# Patient Record
Sex: Female | Born: 1945 | Race: Black or African American | Hispanic: No | Marital: Single | Smoking: Former smoker
Health system: Southern US, Community
[De-identification: ages and names within clinical notes are randomized; demographics above are authoritative.]

## PROBLEM LIST (undated history)

## (undated) DIAGNOSIS — E7800 Pure hypercholesterolemia, unspecified: Secondary | ICD-10-CM

## (undated) DIAGNOSIS — I10 Essential (primary) hypertension: Secondary | ICD-10-CM

## (undated) HISTORY — PX: TOTAL ABDOMINAL HYSTERECTOMY: SHX209

## (undated) HISTORY — DX: Essential (primary) hypertension: I10

## (undated) HISTORY — PX: CARPAL TUNNEL RELEASE: SHX101

## (undated) HISTORY — DX: Pure hypercholesterolemia, unspecified: E78.00

## (undated) HISTORY — PX: OTHER SURGICAL HISTORY: SHX169

---

## 2009-06-21 HISTORY — PX: COLONOSCOPY: SHX174

## 2010-05-26 ENCOUNTER — Encounter: Payer: Self-pay | Admitting: Gastroenterology

## 2010-06-01 ENCOUNTER — Ambulatory Visit (HOSPITAL_COMMUNITY)
Admission: RE | Admit: 2010-06-01 | Discharge: 2010-06-01 | Payer: Self-pay | Source: Home / Self Care | Attending: Gastroenterology | Admitting: Gastroenterology

## 2010-07-21 NOTE — Letter (Signed)
Summary: [facility] ORDER/TRIAGE  [facility] ORDER/TRIAGE   Imported By: Rexene Alberts 05/26/2010 15:40:57  _____________________________________________________________________  External Attachment:    Type:   Image     Comment:   External Document

## 2017-07-26 ENCOUNTER — Other Ambulatory Visit: Payer: Self-pay | Admitting: General Surgery

## 2017-07-26 DIAGNOSIS — R2231 Localized swelling, mass and lump, right upper limb: Secondary | ICD-10-CM

## 2017-08-17 ENCOUNTER — Ambulatory Visit: Payer: Medicare HMO

## 2017-08-25 ENCOUNTER — Ambulatory Visit
Admission: RE | Admit: 2017-08-25 | Discharge: 2017-08-25 | Disposition: A | Discharge status: Home / Self Care | Payer: Medicare HMO | Source: Ambulatory Visit | Attending: General Surgery | Admitting: General Surgery

## 2017-08-25 DIAGNOSIS — D171 Benign lipomatous neoplasm of skin and subcutaneous tissue of trunk: Secondary | ICD-10-CM | POA: Diagnosis not present

## 2017-08-25 DIAGNOSIS — M19011 Primary osteoarthritis, right shoulder: Secondary | ICD-10-CM | POA: Diagnosis not present

## 2017-08-25 DIAGNOSIS — R2231 Localized swelling, mass and lump, right upper limb: Secondary | ICD-10-CM | POA: Insufficient documentation

## 2017-08-25 DIAGNOSIS — M7581 Other shoulder lesions, right shoulder: Secondary | ICD-10-CM | POA: Insufficient documentation

## 2017-08-25 DIAGNOSIS — M7551 Bursitis of right shoulder: Secondary | ICD-10-CM | POA: Diagnosis not present

## 2017-08-25 MED ORDER — GADOBENATE DIMEGLUMINE 529 MG/ML IV SOLN
20.0000 mL | Freq: Once | INTRAVENOUS | Status: AC | PRN
  Administered 2017-08-25: 5 mL via INTRAVENOUS

## 2019-05-20 IMAGING — MR MR HUMERUS*R* WO/W CM
9 series · 40 of 40 positions shown · IV contrast (multihance)
Comparison: None.

CLINICAL DATA: Swelling and occasional pain in the right upper arm
for approximately 2.5 years. Area of concern is enlarging.

EXAM:
MRI OF THE RIGHT HUMERUS WITHOUT AND WITH CONTRAST
TECHNIQUE: Multiplanar, multisequence MR imaging of the right humerus was
performed before and after the administration of intravenous
contrast.
CONTRAST:  5 ml MULTIHANCE GADOBENATE DIMEGLUMINE 529 MG/ML IV SOLN

[Series 3: T1 · axial · 5.0mm · 1.37mm/px · z∈[-70,+138]mm · 5 of 33 slices shown (1 of 2)]
[im 1/33]
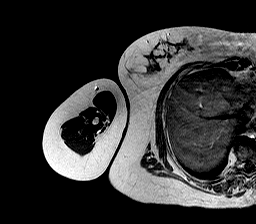
[im 9/33]
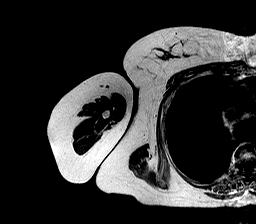
[im 17/33]
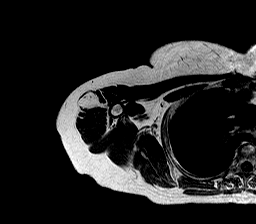
[im 25/33]
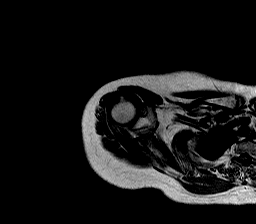
[im 33/33]
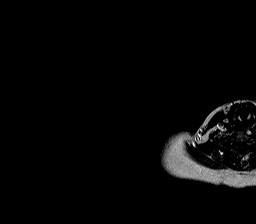

[Series 4: T1 fat-sat · axial · 5.0mm · 1.37mm/px · z∈[-70,+138]mm · 4 of 33 slices shown]
[im 1/33]
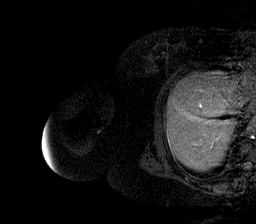
[im 11/33]
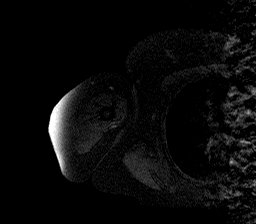
[im 22/33]
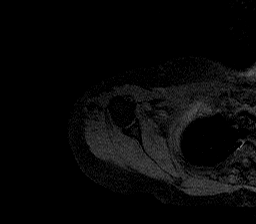
[im 33/33]
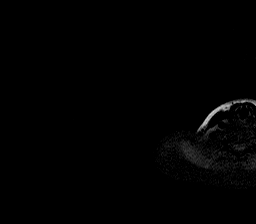

[Series 5: T2 fat-sat · axial · 5.0mm · 0.68mm/px · z∈[-70,+138]mm · 4 of 33 slices shown (1 of 3)]
[im 1/33]
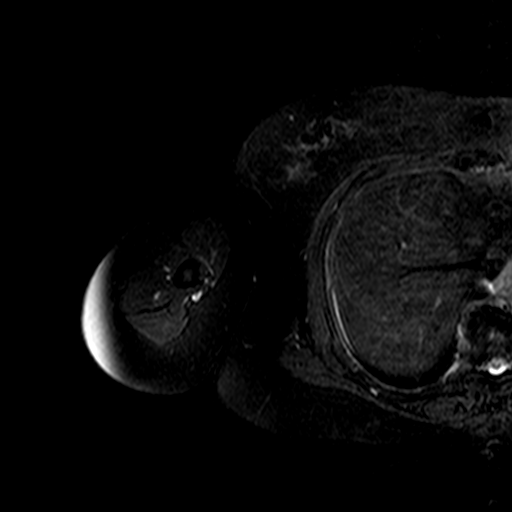
[im 11/33]
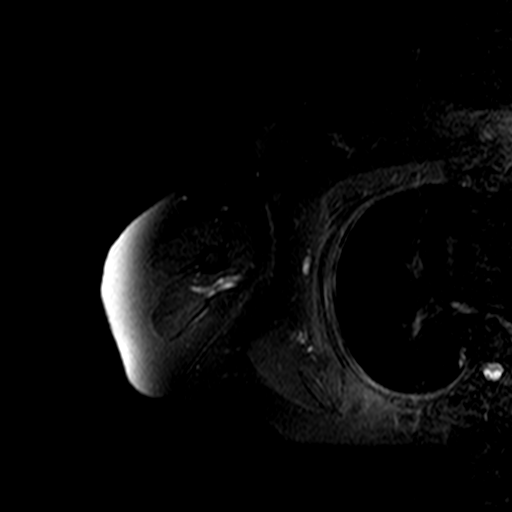
[im 22/33]
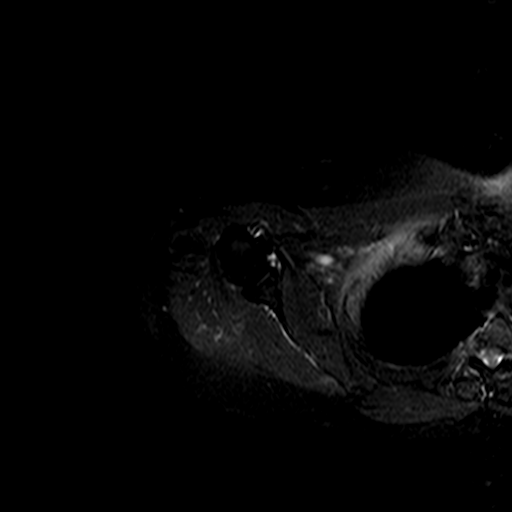
[im 33/33]
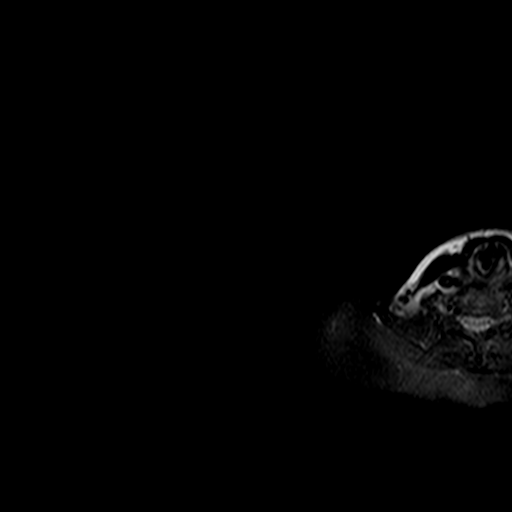

[Series 6: T2 fat-sat · coronal · 4.0mm · 0.68mm/px · 5 of 35 slices shown (2 of 3)]
[im 1/35]
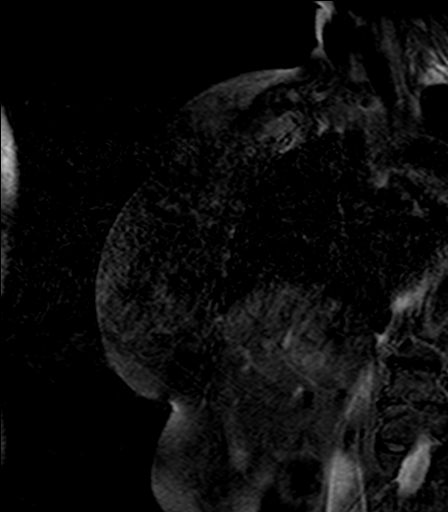
[im 9/35]
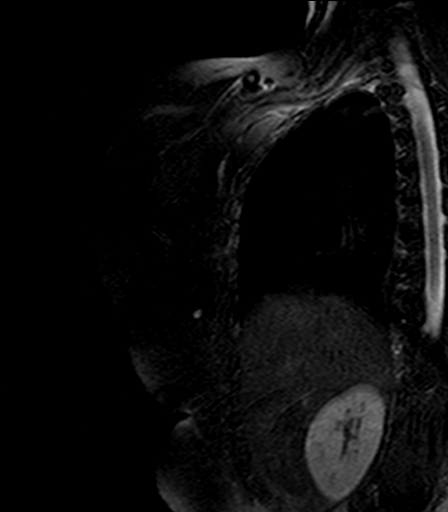
[im 18/35]
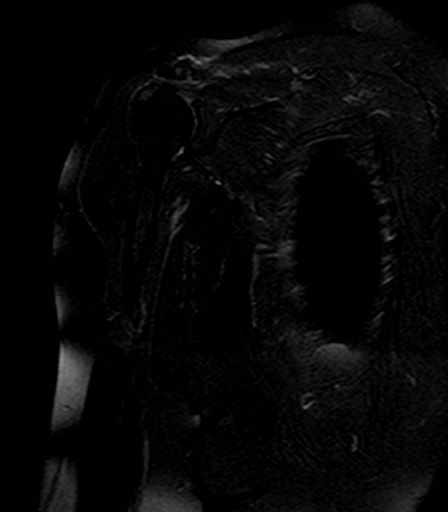
[im 26/35]
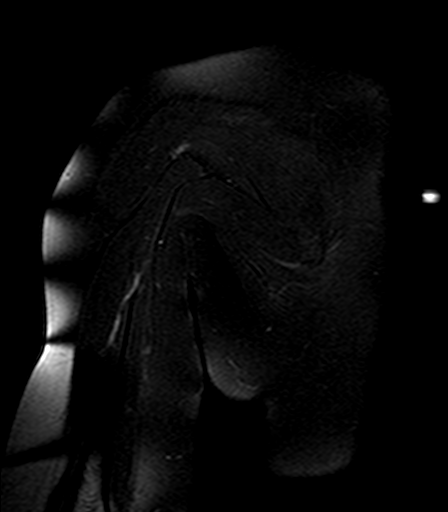
[im 35/35]
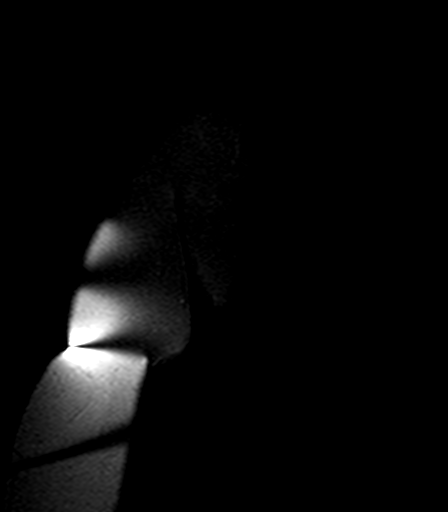

[Series 7: T1 · coronal · 4.0mm · 1.37mm/px · 5 of 35 slices shown (2 of 2)]
[im 1/35]
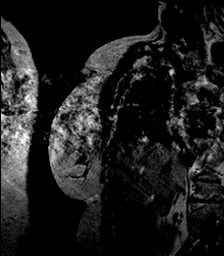
[im 9/35]
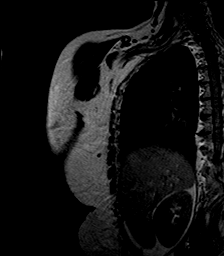
[im 18/35]
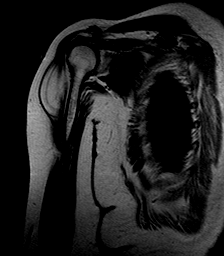
[im 26/35]
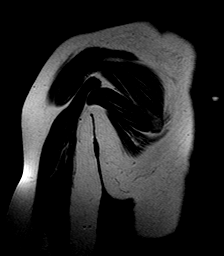
[im 35/35]
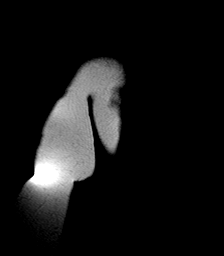

[Series 8: T2 fat-sat · oblique · 4.0mm · 0.78mm/px · 4 of 29 slices shown (3 of 3)]
[im 1/29]
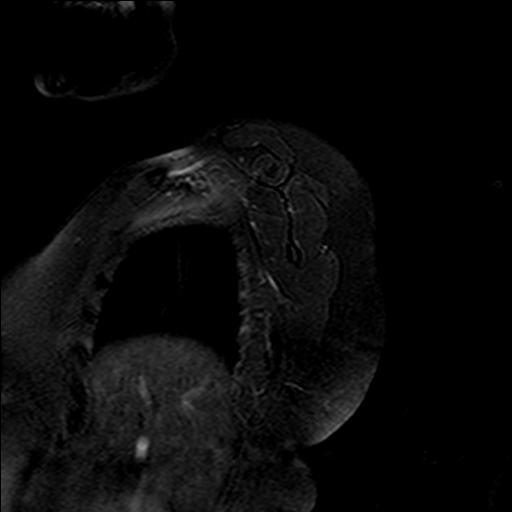
[im 10/29]
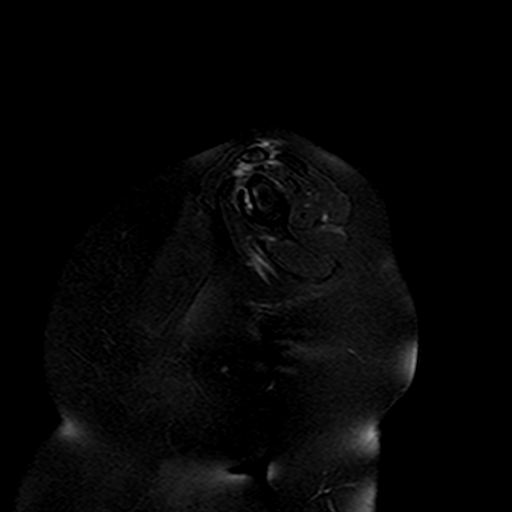
[im 19/29]
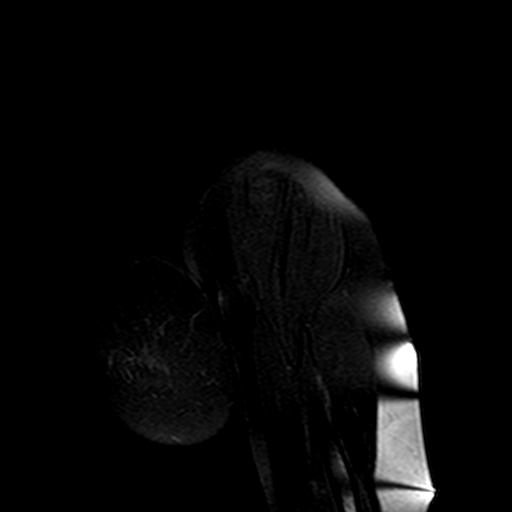
[im 29/29]
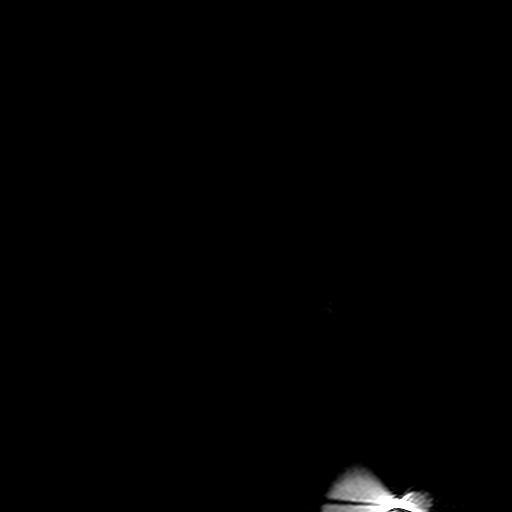

[Series 11: T1 fat-sat post-contrast · oblique · 4.0mm · 1.56mm/px · 4 of 29 slices shown (1 of 3)]
[im 1/29]
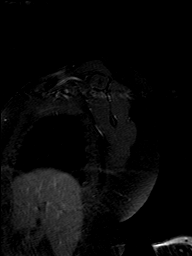
[im 10/29]
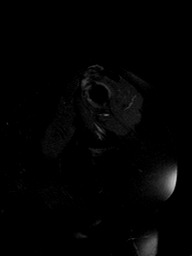
[im 19/29]
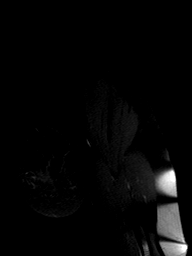
[im 29/29]
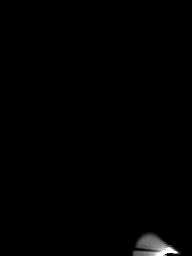

[Series 12: T1 fat-sat post-contrast · axial · 5.0mm · 1.56mm/px · z∈[-70,+138]mm · 4 of 33 slices shown (2 of 3)]
[im 1/33]
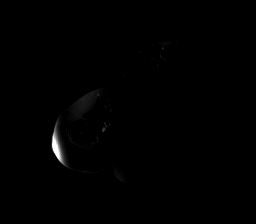
[im 11/33]
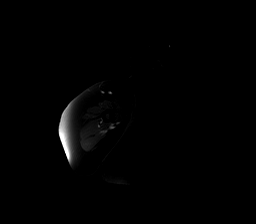
[im 22/33]
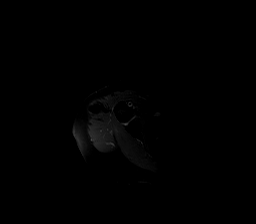
[im 33/33]
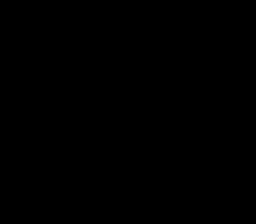

[Series 13: T1 fat-sat post-contrast · coronal · 4.0mm · 1.37mm/px · 5 of 35 slices shown (3 of 3)]
[im 1/35]
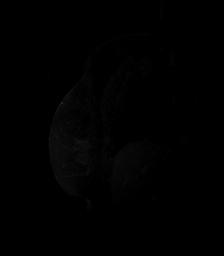
[im 9/35]
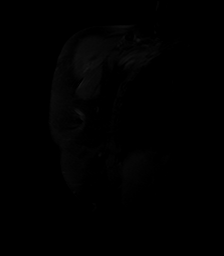
[im 18/35]
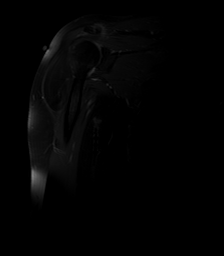
[im 26/35]
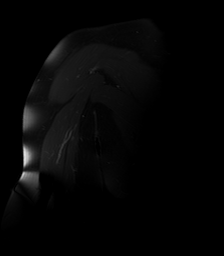
[im 35/35]
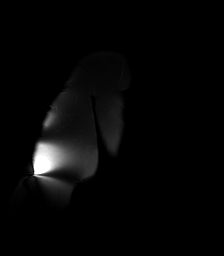

[40 of 40 positions shown; findings below may reference images not displayed]

FINDINGS: Bones/Joint/Cartilage

No fracture or focal lesion. Moderate to moderately severe
acromioclavicular osteoarthritis is identified. Small degenerative
cyst in the greater tuberosity related to rotator cuff tendinopathy
is seen.

Ligaments

Intact.

Muscles and Tendons

Rotator cuff tendinopathy without tear.  Otherwise negative.

Soft tissues

Markers are placed about the region of concern. Subjacent to the
markers, there is a T1 hyperintense lesion in the deltoid measuring
7.5 cm craniocaudal by 2.4 cm AP by 2.6 cm transverse. A few fine
septations or muscular fibers are present within the lesion. The
lesion demonstrates complete signal dropout on T2 weighted imaging
and does not enhance. Imaged soft tissues are otherwise
unremarkable. Fluid is present in the subacromial/subdeltoid bursa.
IMPRESSION: Lesion in the region of concern has an appearance most consistent
with a simple lipoma in the deltoid.

Rotator cuff tendinopathy. Although this is not a dedicated shoulder
examination, no tear is seen.

Moderate to moderately severe acromioclavicular osteoarthritis.

Subacromial/subdeltoid bursitis.

## 2020-05-14 ENCOUNTER — Encounter: Payer: Self-pay | Admitting: Internal Medicine

## 2020-05-22 ENCOUNTER — Encounter: Payer: Self-pay | Admitting: Internal Medicine

## 2020-07-15 ENCOUNTER — Ambulatory Visit: Payer: Medicare HMO | Admitting: Gastroenterology

## 2020-08-27 ENCOUNTER — Other Ambulatory Visit: Payer: Self-pay

## 2020-08-27 ENCOUNTER — Ambulatory Visit: Payer: Self-pay | Admitting: Gastroenterology

## 2020-10-08 ENCOUNTER — Other Ambulatory Visit: Payer: Self-pay

## 2020-10-08 ENCOUNTER — Encounter: Payer: Self-pay | Admitting: *Deleted

## 2020-10-08 ENCOUNTER — Encounter: Payer: Self-pay | Admitting: Gastroenterology

## 2020-10-08 ENCOUNTER — Ambulatory Visit (INDEPENDENT_AMBULATORY_CARE_PROVIDER_SITE_OTHER): Payer: Medicare HMO | Admitting: Gastroenterology

## 2020-10-08 DIAGNOSIS — Z1211 Encounter for screening for malignant neoplasm of colon: Secondary | ICD-10-CM

## 2020-10-08 MED ORDER — PEG 3350-KCL-NA BICARB-NACL 420 G PO SOLR: ORAL | 0 refills | Status: AC

## 2020-10-08 NOTE — Patient Instructions (Signed)
We are arranging a colonoscopy in the near future with Dr. Abbey Chatters.  Please stop iron 7 days prior to the procedure.  Further recommendations to follow!  It was a pleasure to see you today. I want to create trusting relationships with patients to provide genuine, compassionate, and quality care. I value your feedback. If you receive a survey regarding your visit,  I greatly appreciate you taking time to fill this out.   [name], PhD, ANP-BC [facility]

## 2020-10-08 NOTE — Addendum Note (Signed)
Addended by: Cheron Every on: 10/08/2020 10:42 AM   Modules accepted: Orders

## 2020-10-08 NOTE — Progress Notes (Signed)
Primary Care Physician:  Renee Rival, NP  Referring Provider: Angelina Ok, NP Primary Gastroenterologist:  Dr. Abbey Chatters, previously Dr. Oneida Alar   Chief Complaint  Patient presents with  . Colonoscopy    Last tcs 11 years ago at [facility] Doing ok    HPI:   Shannon Larson is a 75 y.o. female presenting today at the request of Angelina Ok, NP, due to need for screening colonoscopy. Her last procedure was by Dr. Oneida Alar in 2011 with diverticulosis. No family history of colorectal cancer or polyps.   She did note weight loss earlier this year from around high 180s to 175. However, she has gained this back.  Had changed diet from 3 meals to 2 meals a day and was eating earlier. Started eating more veggies. Cut back some on starches. Back to baseline weight. No abdominal pain. No nausea or vomiting. No GERD or dysphagia. No overt GI bleeding.    Past Medical History:  Diagnosis Date  . HTN (hypertension)   . Hypercholesterolemia     Past Surgical History:  Procedure Laterality Date  . CARPAL TUNNEL RELEASE Right   . cataracts    . COLONOSCOPY  2011   left-sided diverticulosis most pronounced in sigmoid, small internal hemorrhoids.   Marland Kitchen TOTAL ABDOMINAL HYSTERECTOMY      Current Outpatient Medications  Medication Sig Dispense Refill  . acyclovir (ZOVIRAX) 200 MG capsule Take 200 mg by mouth daily.    Marland Kitchen amLODipine (NORVASC) 10 MG tablet Take 1 tablet by mouth daily.    Marland Kitchen aspirin EC 81 MG tablet Take 81 mg by mouth daily. Swallow whole.    . benazepril (LOTENSIN) 40 MG tablet Take 40 mg by mouth daily.    . diphenhydrAMINE (BENADRYL) 25 MG tablet Take 50 mg by mouth daily.    Marland Kitchen estradiol (CLIMARA - DOSED IN MG/24 HR) 0.05 mg/24hr patch Place 0.05 mg onto the skin 2 (two) times a week.    . ferrous sulfate 325 (65 FE) MG tablet Take 325 mg by mouth daily.    . naproxen sodium (ALEVE) 220 MG tablet Take 440 mg by mouth as needed.    Marland Kitchen OVER THE COUNTER MEDICATION CBD  Salve Herbal Zero TCS 250mg  as needed    . rosuvastatin (CRESTOR) 20 MG tablet Take 20 mg by mouth daily.    . timolol (TIMOPTIC) 0.5 % ophthalmic solution Place 1 drop into both eyes every morning.    . Vitamin D, Ergocalciferol, (DRISDOL) 1.25 MG (50000 UNIT) CAPS capsule Take 1 capsule by mouth once a week.    . zinc gluconate 50 MG tablet Take 50 mg by mouth daily.     No current facility-administered medications for this visit.    Allergies as of 10/08/2020 - Review Complete 10/08/2020  Allergen Reaction Noted  . Other  10/08/2020  . Shellfish allergy  10/08/2020    Family History  Problem Relation Age of Onset  . Prostate cancer Father   . Breast cancer Sister   . Colon polyps Neg Hx   . Colon cancer Neg Hx     Social History   Socioeconomic History  . Marital status: Single    Spouse name: Not on file  . Number of children: Not on file  . Years of education: Not on file  . Highest education level: Not on file  Occupational History  . 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  Smoking status: Former Research scientist (life sciences)  . Smokeless tobacco: Never Used  Substance and Sexual Activity  . Alcohol use: Yes    Comment: glass of wine daily  . Drug use: Never  . Sexual activity: Not on file  Other Topics Concern  . Not on file  Social History Narrative  . Not on file   Social Determinants of Health   Financial Resource Strain: Not on file  Food Insecurity: Not on file  Transportation Needs: Not on file  Physical Activity: Not on file  Stress: Not on file  Social Connections: Not on file  Intimate Partner Violence: Not on file    Review of Systems: Gen: Denies any fever, chills, fatigue, weight loss, lack of appetite.  CV: Denies chest pain, heart palpitations, peripheral edema, syncope.  Resp: Denies shortness of breath at rest or with exertion. Denies wheezing or cough.  GI: see HPI GU : Denies urinary burning, urinary  frequency, urinary hesitancy MS: Denies joint pain, muscle weakness, cramps, or limitation of movement.  Derm: Denies rash, itching, dry skin Psych: Denies depression, anxiety, memory loss, and confusion Heme: Denies bruising, bleeding, and enlarged lymph nodes.  Physical Exam: BP 130/70   Pulse 63   Temp (!) 96.9 F (36.1 C) (Temporal)   Ht 5\' 6"  (1.676 m)   Wt 189 lb 3.2 oz (85.8 kg)   BMI 30.54 kg/m  General:   Alert and oriented. Pleasant and cooperative. Well-nourished and well-developed.  Head:  Normocephalic and atraumatic. Eyes:  Without icterus, sclera clear and conjunctiva pink.  Ears:  Normal auditory acuity. Mouth:  Mask in place Lungs:  Clear to auscultation bilaterally. No wheezes, rales, or rhonchi. No distress.  Heart:  S1, S2 present without murmurs appreciated.  Abdomen:  +BS, soft, non-tender and non-distended. No HSM noted. No guarding or rebound. No masses appreciated.  Rectal:  Deferred  Msk:  Symmetrical without gross deformities. Normal posture. Extremities:  Without edema. Neurologic:  Alert and  oriented x4;  grossly normal neurologically. Skin:  Intact without significant lesions or rashes. Psych:  Alert and cooperative. Normal mood and affect.  ASSESSMENT: Shannon Larson is a delightful 75 y.o. female presenting today with need for routine screening colonoscopy, without any family history of colorectal cancer or polyps. Last colonoscopy without polyps in 2011.  She is in excellent health and would do well with screening colonoscopy at this time. She did note weight loss earlier this year in the setting of dietary changes but has actually gained back to her baseline. She has no concerning lower or upper GI signs/symptoms.    PLAN:  Proceed with colonoscopy by Dr. Abbey Chatters  in near future: the risks, benefits, and alternatives have been discussed with the patient in detail. The patient states understanding and desires to proceed.   Hold iron 7 days  prior  [name], PhD, [facility]

## 2020-11-25 ENCOUNTER — Telehealth: Payer: Self-pay | Admitting: Internal Medicine

## 2020-11-25 NOTE — Telephone Encounter (Signed)
Pt wants to cancel her procedure with Dr Abbey Chatters on 6/17 due to the high rise in covid cases. She is aware that she will need another OV prior to scheduling, She wants to wait until next year. [phone]

## 2020-11-26 NOTE — Telephone Encounter (Signed)
Called pt. Aware procedure cancelled. Message sent to endo

## 2020-12-03 ENCOUNTER — Other Ambulatory Visit (HOSPITAL_COMMUNITY): Payer: Medicare HMO

## 2020-12-05 ENCOUNTER — Ambulatory Visit (HOSPITAL_COMMUNITY): Admission: RE | Admit: 2020-12-05 | Payer: Medicare HMO | Source: Home / Self Care

## 2020-12-05 ENCOUNTER — Encounter (HOSPITAL_COMMUNITY): Admission: RE | Payer: Self-pay | Source: Home / Self Care

## 2020-12-05 SURGERY — COLONOSCOPY WITH PROPOFOL
Anesthesia: Monitor Anesthesia Care

## 2021-05-14 DIAGNOSIS — G4733 Obstructive sleep apnea (adult) (pediatric): Secondary | ICD-10-CM | POA: Insufficient documentation

## 2021-05-14 DIAGNOSIS — I509 Heart failure, unspecified: Secondary | ICD-10-CM | POA: Insufficient documentation

## 2021-05-14 DIAGNOSIS — R55 Syncope and collapse: Secondary | ICD-10-CM | POA: Insufficient documentation

## 2021-05-14 DIAGNOSIS — N179 Acute kidney failure, unspecified: Secondary | ICD-10-CM | POA: Insufficient documentation

## 2021-05-14 DIAGNOSIS — E119 Type 2 diabetes mellitus without complications: Secondary | ICD-10-CM | POA: Insufficient documentation

## 2021-05-14 DIAGNOSIS — I10 Essential (primary) hypertension: Secondary | ICD-10-CM | POA: Insufficient documentation

## 2021-05-14 DIAGNOSIS — J101 Influenza due to other identified influenza virus with other respiratory manifestations: Secondary | ICD-10-CM | POA: Insufficient documentation

## 2021-05-14 DIAGNOSIS — J449 Chronic obstructive pulmonary disease, unspecified: Secondary | ICD-10-CM | POA: Insufficient documentation

## 2022-06-28 ENCOUNTER — Ambulatory Visit (INDEPENDENT_AMBULATORY_CARE_PROVIDER_SITE_OTHER): Payer: Medicare HMO | Admitting: Orthopedic Surgery

## 2022-06-28 ENCOUNTER — Ambulatory Visit (INDEPENDENT_AMBULATORY_CARE_PROVIDER_SITE_OTHER): Payer: Medicare HMO

## 2022-06-28 VITALS — BP 122/76 | HR 86 | Ht 66.0 in | Wt 159.0 lb

## 2022-06-28 DIAGNOSIS — M4317 Spondylolisthesis, lumbosacral region: Secondary | ICD-10-CM

## 2022-06-28 DIAGNOSIS — M25551 Pain in right hip: Secondary | ICD-10-CM | POA: Diagnosis not present

## 2022-06-28 DIAGNOSIS — M79604 Pain in right leg: Secondary | ICD-10-CM

## 2022-06-28 DIAGNOSIS — M1611 Unilateral primary osteoarthritis, right hip: Secondary | ICD-10-CM

## 2022-06-28 DIAGNOSIS — M1610 Unilateral primary osteoarthritis, unspecified hip: Secondary | ICD-10-CM

## 2022-06-28 MED ORDER — PREDNISONE 10 MG (48) PO TBPK: ORAL_TABLET | Freq: Every day | ORAL | 0 refills | Status: AC

## 2022-06-28 MED ORDER — METHOCARBAMOL 500 MG PO TABS: 500.0000 mg | ORAL_TABLET | Freq: Three times a day (TID) | ORAL | 1 refills | Status: AC

## 2022-06-28 NOTE — Progress Notes (Signed)
Chief Complaint  Patient presents with   Hip Pain    Right hip and leg pain     HPI: This is a 77 year old female who gives an unusual history of being in her normal state of health and waking up 1 day with cervical spine pain and headache which progressively increased to include lower back pain radicular right leg pain and groin pain  She says she was normal before this occurred.  She did seek help with her primary care physicians and they did give her an injection of steroid and then a another injection probably Toradol and she also took some oral prednisone and cyclobenzaprine  Although she has made some modest improvement she is still having both the neck pain and headache as well as the back pain groin pain and radicular pain in the right leg.  She is now using a cane where she was not using it before  She is adamant that there was no traumatic injury  She said she did have an order for physical therapy but thought she better get further clarification of her problem before starting  Past Medical History:  Diagnosis Date   HTN (hypertension)    Hypercholesterolemia     BP 122/76   Pulse 86   Ht '5\' 6"'$  (1.676 m)   Wt 159 lb (72.1 kg)   BMI 25.66 kg/m    General appearance: Well-developed well-nourished no gross deformities  Cardiovascular normal pulse and perfusion normal color without edema  Neurologically no sensation loss or deficits or pathologic reflexes  Psychological: Awake alert and oriented x3 mood and affect normal  Skin no lacerations or ulcerations no nodularity no palpable masses, no erythema or nodularity  Musculoskeletal:  Right hip flexion compared to left hip flexion were normal with the exception that the right hip was painful.  She had no restriction in internal rotation or flexion.  And then she had pain in her lower back and right buttock although she had normal reflexes on both sides and normal strength without atrophy in both legs and no evidence  of straight leg raise findings although she had some discomfort on the right compared to the left with a straight leg raise maneuver  Imaging lumbar spine films show that she does have a spondylolisthesis please see the x-ray report  She also has arthritis in both hips although the right is the only 1 that is symptomatic  A/P  Encounter Diagnoses  Name Primary?   Pain in right hip    Pain in right leg    Hip arthritis    Spondylolisthesis, lumbosacral region Yes   I think the groin pain is contributed to the hip arthritis.  The x-ray that does show that she has joint space narrowing right at the apex of the femoral head  However her lumbar spine shows degenerative disc disease and spondylolisthesis and this would account for her radicular symptoms  So she basically has 2 problems  However, she has not had an adequate course of nonoperative treatment and should start physical therapy and continue with medications as noted below  Meds ordered this encounter  Medications   predniSONE (STERAPRED UNI-PAK 48 TAB) 10 MG (48) TBPK tablet    Sig: Take by mouth daily. 10 mg ds as directed 12 days    Dispense:  48 tablet    Refill:  0   methocarbamol (ROBAXIN) 500 MG tablet    Sig: Take 1 tablet (500 mg total) by mouth 3 (three) times daily.  Dispense:  60 tablet    Refill:  1   I will see him in about 2 months to see if this has helped if not then referral can be made regarding her hip for arthroplasty as well as MRI image for neural elements  As far as her neck symptoms go primary care should refer her to a neurosurgeon as they can get better care and recommendations regarding cervical disc and lumbar disc disease

## 2022-06-30 ENCOUNTER — Other Ambulatory Visit (HOSPITAL_COMMUNITY): Payer: Self-pay | Admitting: Nurse Practitioner

## 2022-06-30 DIAGNOSIS — R519 Headache, unspecified: Secondary | ICD-10-CM

## 2022-07-20 ENCOUNTER — Ambulatory Visit (HOSPITAL_COMMUNITY)
Admission: RE | Admit: 2022-07-20 | Discharge: 2022-07-20 | Disposition: A | Discharge status: Home / Self Care | Payer: Medicare HMO | Source: Ambulatory Visit | Attending: Nurse Practitioner | Admitting: Nurse Practitioner

## 2022-07-20 DIAGNOSIS — R519 Headache, unspecified: Secondary | ICD-10-CM | POA: Diagnosis present

## 2022-08-20 ENCOUNTER — Telehealth: Payer: Self-pay | Admitting: Orthopedic Surgery

## 2022-08-20 NOTE — Telephone Encounter (Signed)
Spoke w/the patient, she stated that Dr. Aline Brochure referred her for PT at [facility] in [place] and she never went.  She stated that she apologizes, that she has now cleared her calendar to be able to commit to PT.  She would like Dr. Aline Brochure to refer her again.  She has canceled her 08/30/22 appointment with Dr. Aline Brochure and will reschedule after she sets up her PT appts.  Pt's # [phone]

## 2022-08-23 NOTE — Telephone Encounter (Signed)
Sent order again via fax.

## 2022-08-30 ENCOUNTER — Ambulatory Visit: Payer: Medicare HMO | Admitting: Orthopedic Surgery
